# Patient Record
Sex: Female | Born: 1995 | Race: White | Hispanic: Yes | Marital: Single | Smoking: Never smoker
Health system: Southern US, Community
[De-identification: ages and names within clinical notes are randomized; demographics above are authoritative.]

## PROBLEM LIST (undated history)

## (undated) DIAGNOSIS — R51 Headache: Secondary | ICD-10-CM

## (undated) HISTORY — DX: Headache: R51

---
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
## 2014-12-29 ENCOUNTER — Encounter: Payer: Self-pay | Admitting: Emergency Medicine

## 2014-12-29 ENCOUNTER — Emergency Department
Admission: EM | Admit: 2014-12-29 | Discharge: 2014-12-29 | Disposition: A | Discharge status: Home / Self Care | Payer: No Typology Code available for payment source | Attending: Emergency Medicine | Admitting: Emergency Medicine

## 2014-12-29 ENCOUNTER — Emergency Department: Payer: No Typology Code available for payment source

## 2014-12-29 DIAGNOSIS — R102 Pelvic and perineal pain unspecified side: Secondary | ICD-10-CM

## 2014-12-29 DIAGNOSIS — Z3202 Encounter for pregnancy test, result negative: Secondary | ICD-10-CM | POA: Diagnosis not present

## 2014-12-29 DIAGNOSIS — R1032 Left lower quadrant pain: Secondary | ICD-10-CM | POA: Diagnosis present

## 2014-12-29 LAB — WET PREP, GENITAL
Clue Cells Wet Prep HPF POC: NONE SEEN
Trich, Wet Prep: NONE SEEN
Yeast Wet Prep HPF POC: NONE SEEN

## 2014-12-29 LAB — COMPREHENSIVE METABOLIC PANEL
ALK PHOS: 59 U/L (ref 38–126)
ALT: 14 U/L (ref 14–54)
ANION GAP: 7 (ref 5–15)
AST: 20 U/L (ref 15–41)
Albumin: 4.7 g/dL (ref 3.5–5.0)
BUN: 8 mg/dL (ref 6–20)
CO2: 25 mmol/L (ref 22–32)
Calcium: 9.3 mg/dL (ref 8.9–10.3)
Chloride: 107 mmol/L (ref 101–111)
Creatinine, Ser: 0.54 mg/dL (ref 0.44–1.00)
GFR calc Af Amer: >60 mL/min (ref >60)
GFR calc non Af Amer: >60 mL/min (ref >60)
GLUCOSE: 98 mg/dL (ref 65–99)
POTASSIUM: 3.3 mmol/L — AB (ref 3.5–5.1)
SODIUM: 139 mmol/L (ref 135–145)
Total Bilirubin: 0.6 mg/dL (ref 0.3–1.2)
Total Protein: 7.7 g/dL (ref 6.5–8.1)

## 2014-12-29 LAB — CBC WITH DIFFERENTIAL/PLATELET
BASOS ABS: 0.1 10*3/uL (ref 0–0.1)
BASOS PCT: 1 %
EOS ABS: 0.1 10*3/uL (ref 0–0.7)
Eosinophils Relative: 1 %
HEMATOCRIT: 38.8 % (ref 35.0–47.0)
HEMOGLOBIN: 13.3 g/dL (ref 12.0–16.0)
Lymphocytes Relative: 15 %
Lymphs Abs: 2 10*3/uL (ref 1.0–3.6)
MCH: 29.9 pg (ref 26.0–34.0)
MCHC: 34.3 g/dL (ref 32.0–36.0)
MCV: 87.1 fL (ref 80.0–100.0)
MONOS PCT: 5 %
Monocytes Absolute: 0.6 10*3/uL (ref 0.2–0.9)
NEUTROS ABS: 10.3 10*3/uL — AB (ref 1.4–6.5)
NEUTROS PCT: 78 %
Platelets: 268 10*3/uL (ref 150–440)
RBC: 4.45 MIL/uL (ref 3.80–5.20)
RDW: 12.8 % (ref 11.5–14.5)
WBC: 13.1 10*3/uL — ABNORMAL HIGH (ref 3.6–11.0)

## 2014-12-29 LAB — URINALYSIS COMPLETE WITH MICROSCOPIC (ARMC ONLY)
Bilirubin Urine: NEGATIVE
Glucose, UA: NEGATIVE mg/dL
HGB URINE DIPSTICK: NEGATIVE
LEUKOCYTES UA: NEGATIVE
NITRITE: NEGATIVE
PH: 5 (ref 5.0–8.0)
PROTEIN: NEGATIVE mg/dL
SPECIFIC GRAVITY, URINE: 1.03 (ref 1.005–1.030)

## 2014-12-29 LAB — LIPASE, BLOOD: Lipase: 27 U/L (ref 22–51)

## 2014-12-29 LAB — POCT PREGNANCY, URINE: PREG TEST UR: NEGATIVE

## 2014-12-29 MED ORDER — HYDROCODONE-ACETAMINOPHEN 5-325 MG PO TABS
1.0000 | ORAL_TABLET | Freq: Once | ORAL | Status: AC
  Administered 2014-12-29: 1 via ORAL
  Filled 2014-12-29: qty 1

## 2014-12-29 MED ORDER — HYDROCODONE-ACETAMINOPHEN 5-325 MG PO TABS: 1.0000 | ORAL_TABLET | ORAL | Status: DC | PRN

## 2014-12-29 NOTE — ED Notes (Signed)
Pt reports felt nauseous yesterday. Denies nausea, vomiting or diarrhea today. Reports LLQ pain. Denies fever.Denies difficulty urinating. LMP started today.

## 2014-12-29 NOTE — ED Provider Notes (Signed)
[facility] Emergency Department Provider Note  ____________________________________________  Time seen: 1820  I have reviewed the triage vital signs and the nursing notes.   HISTORY  Chief Complaint Abdominal Pain     HPI Kristin Todd is a 19 y.o. female who reports began to have pain in her left lower abdomen, left pelvis, last night. It was sharp and continues to be uncomfortable. This morning, she began to have her menstrual cycle. She reports the pain does not feel like usual cramping. It is sharper and more focal. She denies any nausea, vomiting, or diarrhea. She has not had similar symptoms in the past.    Past Medical History  Diagnosis Date  . WUJWJXBJ(478.2)     Patient Active Problem List   Diagnosis Date Noted  . Migraine without aura and without status migrainosus, not intractable 01/08/2013    History reviewed. No pertinent past surgical history.  Current Outpatient Rx  Name  Route  Sig  Dispense  Refill  . HYDROcodone-acetaminophen (NORCO) 5-325 MG tablet   Oral   Take 1 tablet by mouth every 4 (four) hours as needed for moderate pain.   6 tablet   0     Allergies Review of patient's allergies indicates no known allergies.  Family History  Problem Relation Age of Onset  . Migraines Paternal Aunt   . Seizures Paternal Uncle     Seizures as a child    Social History Social History  Substance Use Topics  . Smoking status: Never Smoker   . Smokeless tobacco: Never Used  . Alcohol Use: No    Review of Systems  Constitutional: Negative for fever. ENT: Negative for sore throat. Cardiovascular: Negative for chest pain. Respiratory: Negative for shortness of breath. Gastrointestinal: Negative for vomiting and diarrhea. Positive for pain in the left lower quadrant. See history of present illness Genitourinary: Notable for pelvic pain. See history of present illness Musculoskeletal: No myalgias or  injuries. Skin: Negative for rash. Neurological: Negative for headaches   10-point ROS otherwise negative.  ____________________________________________   PHYSICAL EXAM:  VITAL SIGNS: ED Triage Vitals  Enc Vitals Group     BP 12/29/14 1518 125/79 mmHg     Pulse Rate 12/29/14 1518 102     Resp 12/29/14 1518 18     Temp 12/29/14 1518 98.3 F (36.8 C)     Temp Source 12/29/14 1518 Oral     SpO2 12/29/14 1518 100 %     Weight 12/29/14 1518 125 lb (56.7 kg)     Height 12/29/14 1518  (1.626 m)     Head Cir --      Peak Flow --      Pain Score 12/29/14 1519 5     Pain Loc --      Pain Edu? --      Excl. in GC? --     Constitutional:  Alert and oriented. Well appearing and in no distress. ENT   Head: Normocephalic and atraumatic.   Nose: No congestion/rhinnorhea.   Mouth/Throat: Mucous membranes are moist. Cardiovascular: Normal rate, regular rhythm, no murmur noted Respiratory:  Normal respiratory effort, no tachypnea.    Breath sounds are clear and equal bilaterally.  Gastrointestinal: Soft. Some discomfort in the lower left abdomen, left pelvic area. No distention.  Pelvic: Normal female external genitalia. Normal speculum exam, with blood present, but no other discharge. Normal-appearing cervix. No cervical motion tenderness. Noted left adnexal tenderness on bimanual exam. Back: No muscle spasm, no  tenderness, no CVA tenderness. Musculoskeletal: No deformity noted. Nontender with normal range of motion in all extremities.  No noted edema. Neurologic:  Normal speech and language. No gross focal neurologic deficits are appreciated.  Skin:  Skin is warm, dry. No rash noted. Psychiatric: Mood and affect are normal. Speech and behavior are normal.  ____________________________________________    LABS (pertinent positives/negatives)  Labs Reviewed  WET PREP, GENITAL - Abnormal; Notable for the following:    WBC, Wet Prep HPF POC RARE (*)    All other  components within normal limits  CBC WITH DIFFERENTIAL/PLATELET - Abnormal; Notable for the following:    WBC 13.1 (*)    Neutro Abs 10.3 (*)    All other components within normal limits  COMPREHENSIVE METABOLIC PANEL - Abnormal; Notable for the following:    Potassium 3.3 (*)    All other components within normal limits  URINALYSIS COMPLETEWITH MICROSCOPIC ([facility] ONLY) - Abnormal; Notable for the following:    Color, Urine YELLOW (*)    APPearance CLEAR (*)    Ketones, ur 1+ (*)    Bacteria, UA RARE (*)    Squamous Epithelial / LPF 0-5 (*)    All other components within normal limits  CHLAMYDIA/NGC RT PCR ([facility] ONLY)  LIPASE, BLOOD  POC URINE PREG, ED  POCT PREGNANCY, URINE   ____________________________________________    RADIOLOGY  Pelvic ultrasound IMPRESSION: Moderate amount of free fluid is nonspecific.  Normal arterial and venous Doppler flow is documented in both ovaries.  ____________________________________________   INITIAL IMPRESSION / ASSESSMENT AND PLAN / ED COURSE  Pertinent labs & imaging results that were available during my care of the patient were reviewed by me and considered in my medical decision making (see chart for details).  19 year old female with left adnexal pain. We will perform an ultrasound to assess for cyst versus torsion.  ----------------------------------------- 8:18 PM on 12/29/2014 -----------------------------------------  Ultrasound does not show any cystic structures. There is a moderate amount of free fluid in the pelvis.   At this time, the patient looks more comfortable lying on her left side. We will treat her pain with Vicodin.  I discussed performing a pelvic exam, and the patient agrees and consents.  ----------------------------------------- 10:15 PM on 12/29/2014 -----------------------------------------  Pelvic exam was unremarkable except for left adnexal tenderness as noted in the exam portion  above.  The patient feels much more comfortable left her having had one Vicodin. We will prescribe 6 tablets to be used as needed.  She has been seen at [facility] in the past and I will ask her to follow with them.  ____________________________________________   FINAL CLINICAL IMPRESSION(S) / ED DIAGNOSES  Final diagnoses:  Pelvic pain in female       Darien Ramus, MD 12/29/14 2219

## 2014-12-29 NOTE — ED Notes (Signed)
Began yesterday, nausea but no vomiting, no diarrhea

## 2014-12-30 LAB — CHLAMYDIA/NGC RT PCR (ARMC ONLY)
Chlamydia Tr: NOT DETECTED
N gonorrhoeae: NOT DETECTED

## 2016-07-08 IMAGING — US US ART/VEN ABD/PELV/SCROTUM DOPPLER LTD
1 series · 13 of 25 positions shown · non-contrast
Comparison: None.

CLINICAL DATA: Left adnexal pain for 24 hr

EXAM:
TRANSABDOMINAL AND TRANSVAGINAL ULTRASOUND OF PELVIS
DOPPLER ULTRASOUND OF OVARIES
TECHNIQUE: Both transabdominal and transvaginal ultrasound examinations of the
pelvis were performed. Transabdominal technique was performed for
global imaging of the pelvis including uterus, ovaries, adnexal
regions, and pelvic cul-de-sac.
It was necessary to proceed with endovaginal exam following the
transabdominal exam to visualize the adnexa. Color and duplex
Doppler ultrasound was utilized to evaluate blood flow to the
ovaries.

[Series 1: us art/ven abd/pelv/scrotum doppler ltd · 0.20mm/px · 13 of 133 slices shown]
[im 1/133]
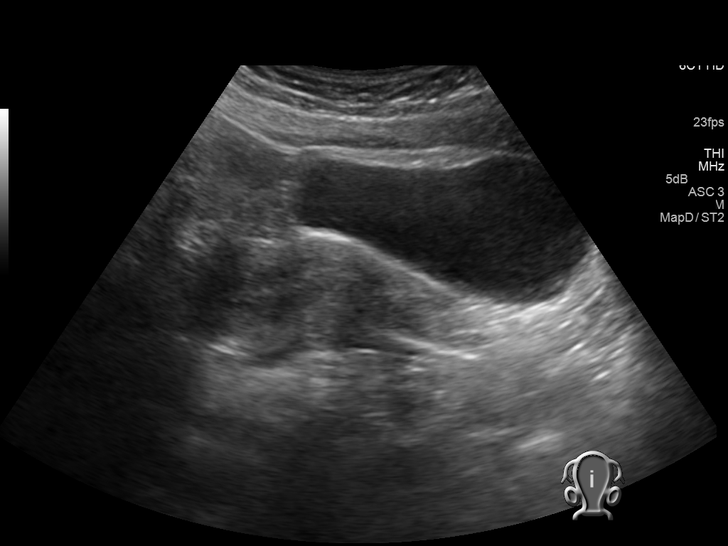
[im 12/133]
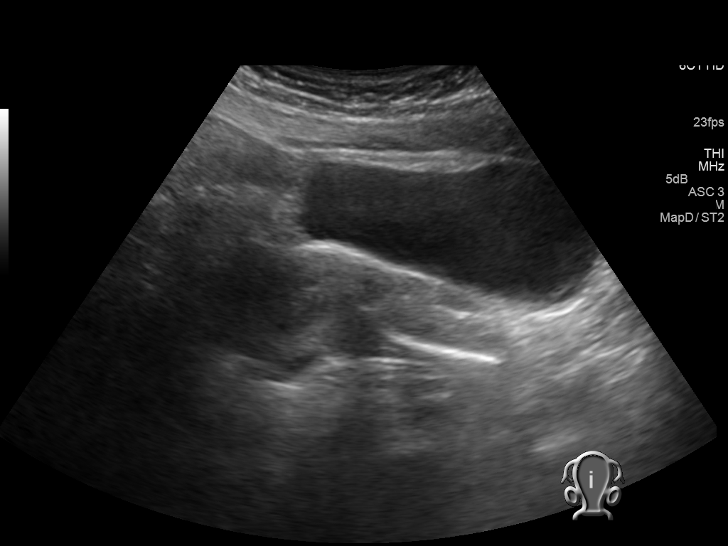
[im 23/133]
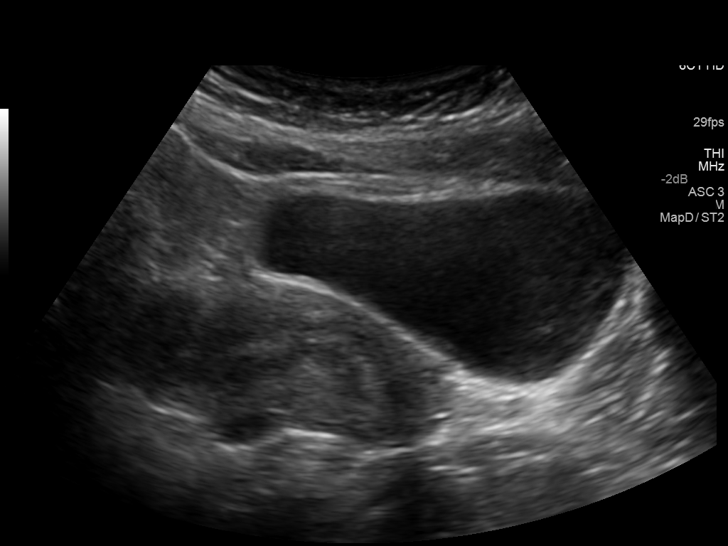
[im 34/133]
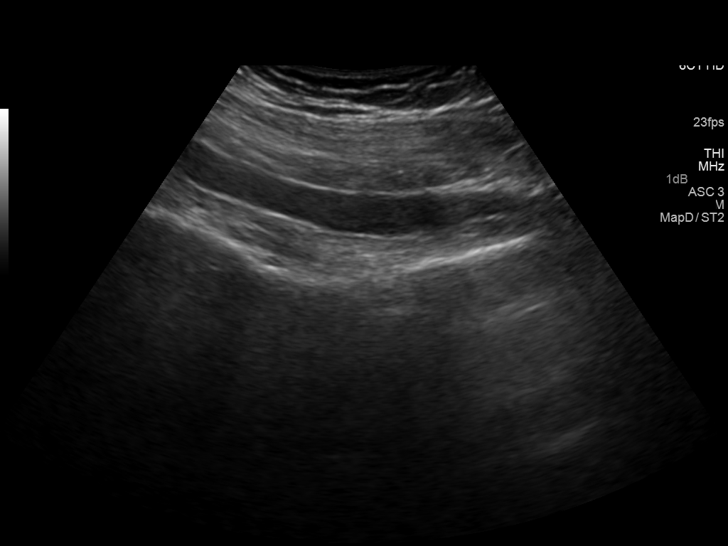
[im 45/133]
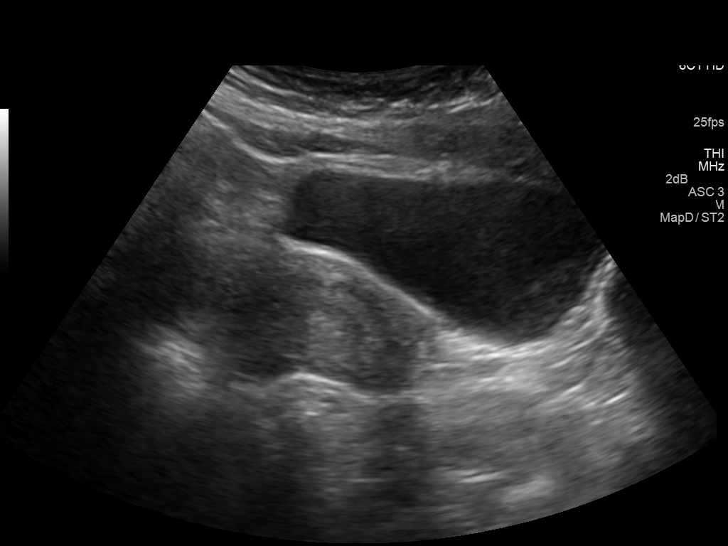
[im 56/133]
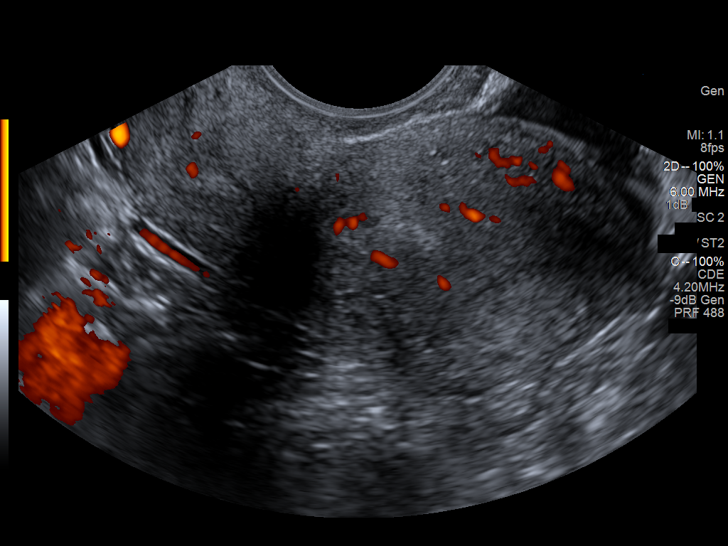
[im 67/133]
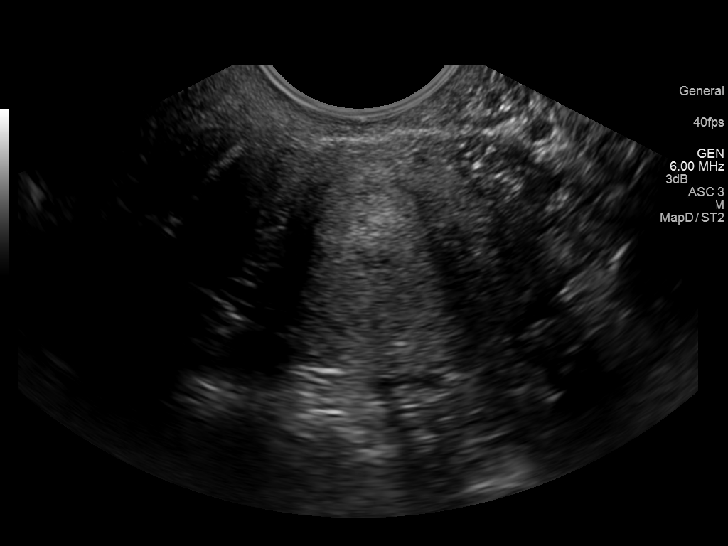
[im 78/133]
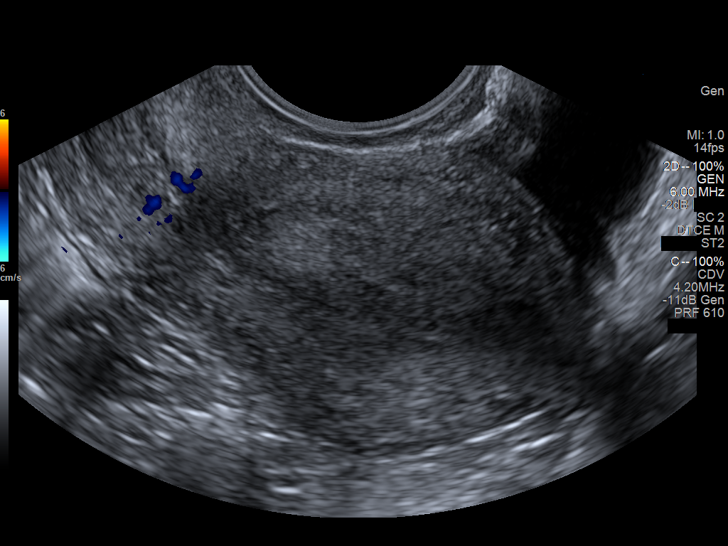
[im 89/133]
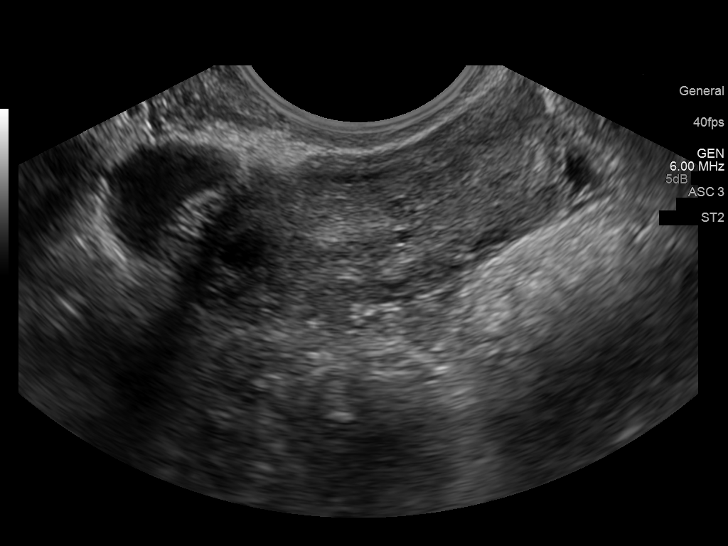
[im 100/133]
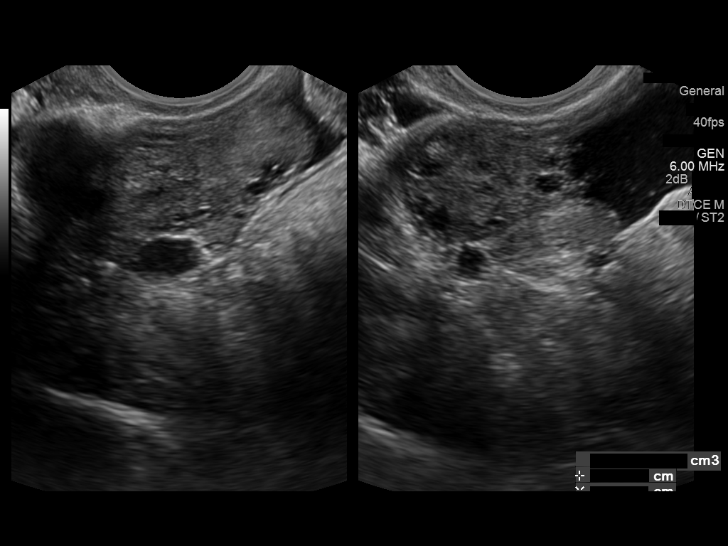
[im 111/133]
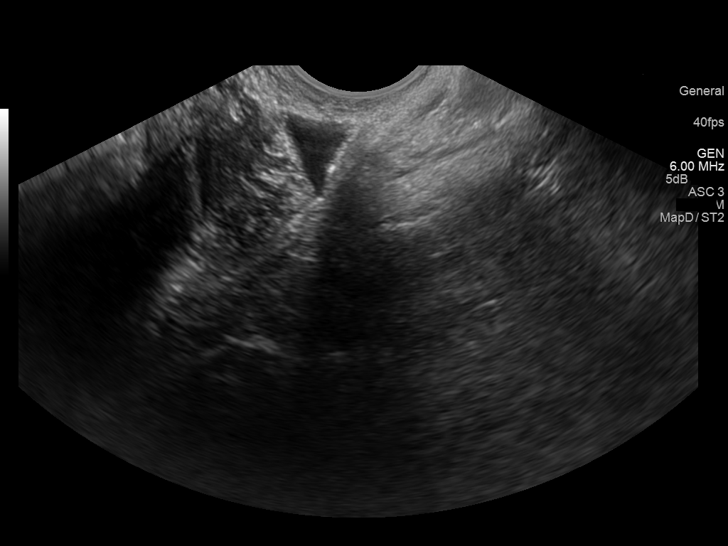
[im 122/133]
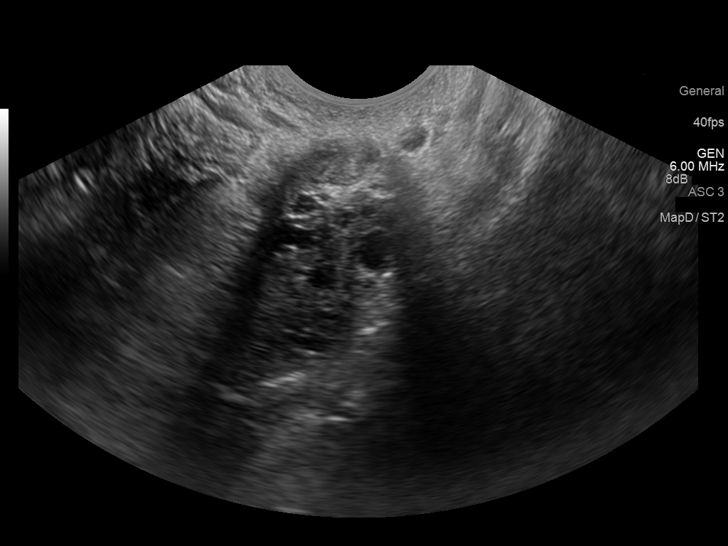
[im 133/133]
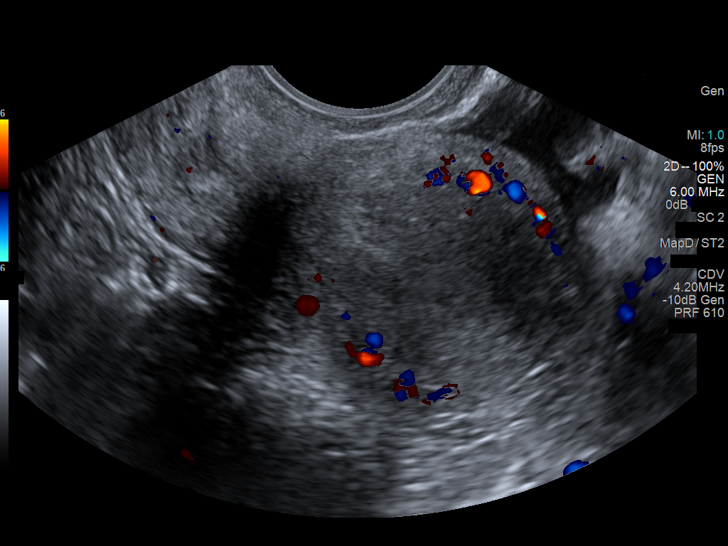

[13 of 25 positions shown; findings below may reference images not displayed]

FINDINGS: Uterus

Measurements: 6.2 x 3.3 x 4.4 cm. No fibroids or other mass
visualized.

Endometrium

Thickness: 11 mm.  No focal abnormality visualized.

Right ovary

Measurements: 4.0 x 2.2 x 2.8 cm. Normal appearance/no adnexal mass.

Left ovary

Measurements: 2.5 x 1.5 x 1.8 cm. Normal appearance/no adnexal mass.

Pulsed Doppler evaluation of both ovaries demonstrates normal
low-resistance arterial and venous waveforms.

Other findings

There is a moderate amount of free fluid which is nonspecific.
IMPRESSION: Moderate amount of free fluid is nonspecific.

Normal arterial and venous Doppler flow is documented in both
ovaries.

## 2017-08-21 ENCOUNTER — Emergency Department
Admission: EM | Admit: 2017-08-21 | Discharge: 2017-08-21 | Disposition: A | Discharge status: Home / Self Care | Payer: No Typology Code available for payment source | Attending: Emergency Medicine | Admitting: Emergency Medicine

## 2017-08-21 ENCOUNTER — Encounter: Payer: Self-pay | Admitting: Emergency Medicine

## 2017-08-21 ENCOUNTER — Other Ambulatory Visit: Payer: Self-pay

## 2017-08-21 DIAGNOSIS — Z79899 Other long term (current) drug therapy: Secondary | ICD-10-CM | POA: Insufficient documentation

## 2017-08-21 DIAGNOSIS — R51 Headache: Secondary | ICD-10-CM | POA: Diagnosis present

## 2017-08-21 DIAGNOSIS — G43001 Migraine without aura, not intractable, with status migrainosus: Secondary | ICD-10-CM | POA: Diagnosis not present

## 2017-08-21 MED ORDER — DIPHENHYDRAMINE HCL 50 MG/ML IJ SOLN
25.0000 mg | Freq: Once | INTRAMUSCULAR | Status: AC
  Administered 2017-08-21: 25 mg via INTRAVENOUS
  Filled 2017-08-21: qty 1

## 2017-08-21 MED ORDER — SODIUM CHLORIDE 0.9 % IV BOLUS
1000.0000 mL | Freq: Once | INTRAVENOUS | Status: AC
  Administered 2017-08-21: 1000 mL via INTRAVENOUS

## 2017-08-21 MED ORDER — KETOROLAC TROMETHAMINE 30 MG/ML IJ SOLN
30.0000 mg | Freq: Once | INTRAMUSCULAR | Status: AC
  Administered 2017-08-21: 30 mg via INTRAVENOUS
  Filled 2017-08-21: qty 1

## 2017-08-21 MED ORDER — ONDANSETRON HCL 4 MG/2ML IJ SOLN
4.0000 mg | Freq: Once | INTRAMUSCULAR | Status: AC
  Administered 2017-08-21: 4 mg via INTRAVENOUS
  Filled 2017-08-21: qty 2

## 2017-08-21 NOTE — ED Notes (Signed)
See triage note  States she developed headache about 2 weeks ago no fever or trauma  States she has had some n/v  Last time vomited was yesterday  Afebrile on arrival   States she has been taking ibu with min relief.    States pain is frontal area and at temporal areas

## 2017-08-21 NOTE — Discharge Instructions (Addendum)
Drink plenty of fluids.  Take over-the-counter Tylenol or ibuprofen as needed for pain.  If you are having pain that is radiating up to the temples try using the muscle relaxer as this may be a tension headache.  Make an appointment with a neurologist to evaluate your headaches since they seem to be worsening.  Return to emergency department if you are becoming worse.

## 2017-08-21 NOTE — ED Provider Notes (Signed)
[facility] Emergency Department Provider Note  ____________________________________________   First MD Initiated Contact with Patient 08/21/17 1434     (approximate)  I have reviewed the triage vital signs and the nursing notes.   HISTORY  Chief Complaint Headache    HPI Kristin Todd is a 22 y.o. female presents emergency department complaining of a headache for 2 weeks.  She denies any fever or chills.  She denies head congestion or cough.  She states she does have some nausea and vomiting due to the headache.  She states she is sensitive to light.  She has been taking ibuprofen without relief.  She states she was seen at the acute care and they gave her something a little stronger than ibuprofen but is not helped.  She is becoming concerned she goes to bed with a headache and wakes with a headache.  She denies any known tick bites.  She states the pain is mostly at her temples.  Past Medical History:  Diagnosis Date  . WJXBJYNW(295.6)     Patient Active Problem List   Diagnosis Date Noted  . Migraine without aura and without status migrainosus, not intractable 01/08/2013    History reviewed. No pertinent surgical history.  Prior to Admission medications   Medication Sig Start Date End Date Taking? Authorizing Provider  cyclobenzaprine (FLEXERIL) 10 MG tablet Take 10 mg by mouth 3 (three) times daily as needed for muscle spasms.   Yes [provider]  naproxen (NAPROSYN) 500 MG tablet Take 500 mg by mouth 2 (two) times daily with a meal.   Yes [provider]    Allergies Patient has no known allergies.  Family History  Problem Relation Age of Onset  . Migraines Paternal Aunt   . Seizures Paternal Uncle        Seizures as a child    Social History Social History   Tobacco Use  . Smoking status: Never Smoker  . Smokeless tobacco: Never Used  Substance Use Topics  . Alcohol use: No  . Drug use: No     Review of Systems  Constitutional: No fever/chills, positive for headache Eyes: No visual changes.  Positive for light sensitivity ENT: No sore throat. Respiratory: Denies cough Genitourinary: Negative for dysuria. Musculoskeletal: Negative for back pain. Skin: Negative for rash.    ____________________________________________   PHYSICAL EXAM:  VITAL SIGNS: ED Triage Vitals  Enc Vitals Group     BP 08/21/17 1355 138/89     Pulse Rate 08/21/17 1355 (!) 102     Resp 08/21/17 1355 16     Temp 08/21/17 1355 98.3 F (36.8 C)     Temp Source 08/21/17 1355 Oral     SpO2 08/21/17 1355 97 %     Weight 08/21/17 1356 134 lb (60.8 kg)     Height 08/21/17 1356  (1.626 m)     Head Circumference --      Peak Flow --      Pain Score 08/21/17 1356 7     Pain Loc --      Pain Edu? --      Excl. in GC? --     Constitutional: Alert and oriented. Well appearing and in no acute distress. Eyes: Conjunctivae are normal. perrl Head: Atraumatic.  Temples are mildly tender bilaterally Nose: No congestion/rhinnorhea. Mouth/Throat: Mucous membranes are moist.  Throat appears normal neck: Is supple, no lymphadenopathy is noted.  There is no cervical tenderness Cardiovascular: Normal rate, regular rhythm.  Heart sounds are normal Respiratory: Normal respiratory effort.  No retractions, lungs are clear to auscultation GU: deferred Musculoskeletal: FROM all extremities, warm and well perfused Neurologic:  Normal speech and language.  Cranial nerves II through XII are grossly intact Skin:  Skin is warm, dry and intact. No rash noted. Psychiatric: Mood and affect are normal. Speech and behavior are normal.  ____________________________________________   LABS (all labs ordered are listed, but only abnormal results are displayed)  Labs Reviewed - No data to  display ____________________________________________   ____________________________________________  RADIOLOGY    ____________________________________________   PROCEDURES  Procedure(s) performed: Saline lock, 1 L normal saline, Toradol 30 mg IV, Benadryl 25 mg IV, Zofran 4 mg IV  Procedures    ____________________________________________   INITIAL IMPRESSION / ASSESSMENT AND PLAN / ED COURSE  Pertinent labs & imaging results that were available during my care of the patient were reviewed by me and considered in my medical decision making (see chart for details).  Patient is a 22 year old female presents emergency department complaining of headache for about 2 weeks.  She denies fever or injury.  She states she has a history of headaches but this is lasted longer than her normal.  She states that she goes to bed with headache and wakes up with a headache.  She states she had some nausea and vomiting due to the headache.  She tried taking over-the-counter medications without any relief.  She went to the urgent care and they gave her some medication which apparently helped.  She states she is sensitive to light also.  On physical exam patient appears well.  Cranial nerves II through XII are grossly intact.  Remainder the exam is unremarkable  Patient was given saline 1 L IV, Toradol 30 mg IV, Zofran 4 mg IV, and Benadryl 25 mg IV.  After 35 to 45 minutes after the medication patient states she is feeling much better.  The family member with her states that she is very sleepy.  I explained that is coming from the Benadryl.  The patient will remain in the ED until she has had all of the fluids from the saline and then be discharged home.  The patient and family member were given instructions to alternate Tylenol and ibuprofen as needed.  She still has muscle relaxants for tension type headaches and she should use these.  If she is worsening she sugars return to the emergency  department.  She was also given a follow-up with a neurologist.     As part of my medical decision making, I reviewed the following data within the electronic MEDICAL RECORD NUMBER History obtained from family, Nursing notes reviewed and incorporated, Old chart reviewed, Notes from prior ED visits and [place] Controlled Substance Database  ____________________________________________   FINAL CLINICAL IMPRESSION(S) / ED DIAGNOSES  Final diagnoses:  Migraine without aura and with status migrainosus, not intractable      NEW MEDICATIONS STARTED DURING THIS VISIT:  Current Discharge Medication List       Note:  This document was prepared using Dragon voice recognition software and may include unintentional dictation errors.    Faythe Ghee, PA-C 08/21/17 1611    Dionne Bucy, MD 08/21/17 1655

## 2017-08-21 NOTE — ED Triage Notes (Signed)
Says headache temples and back of head for 2 weeks.

## 2018-10-04 ENCOUNTER — Other Ambulatory Visit: Payer: Self-pay

## 2018-10-04 ENCOUNTER — Emergency Department
Admission: EM | Admit: 2018-10-04 | Discharge: 2018-10-04 | Disposition: A | Discharge status: Home / Self Care | Payer: No Typology Code available for payment source | Attending: Emergency Medicine | Admitting: Emergency Medicine

## 2018-10-04 DIAGNOSIS — Y92012 Bathroom of single-family (private) house as the place of occurrence of the external cause: Secondary | ICD-10-CM | POA: Diagnosis not present

## 2018-10-04 DIAGNOSIS — Y999 Unspecified external cause status: Secondary | ICD-10-CM | POA: Insufficient documentation

## 2018-10-04 DIAGNOSIS — T23061A Burn of unspecified degree of back of right hand, initial encounter: Secondary | ICD-10-CM | POA: Diagnosis present

## 2018-10-04 DIAGNOSIS — X12XXXA Contact with other hot fluids, initial encounter: Secondary | ICD-10-CM | POA: Insufficient documentation

## 2018-10-04 DIAGNOSIS — Y93E8 Activity, other personal hygiene: Secondary | ICD-10-CM | POA: Diagnosis not present

## 2018-10-04 DIAGNOSIS — T23261A Burn of second degree of back of right hand, initial encounter: Secondary | ICD-10-CM | POA: Insufficient documentation

## 2018-10-04 MED ORDER — HYDROCODONE-ACETAMINOPHEN 5-325 MG PO TABS: 1.0000 | ORAL_TABLET | Freq: Four times a day (QID) | ORAL | 0 refills | Status: AC | PRN

## 2018-10-04 MED ORDER — SILVER SULFADIAZINE 1 % EX CREA
TOPICAL_CREAM | Freq: Once | CUTANEOUS | Status: AC
Start: 2018-10-04 — End: 2018-10-04
  Administered 2018-10-04: 23:00:00 via TOPICAL
  Filled 2018-10-04: qty 85

## 2018-10-04 MED ORDER — MORPHINE SULFATE (PF) 4 MG/ML IV SOLN
4.0000 mg | Freq: Once | INTRAVENOUS | Status: AC
  Administered 2018-10-04: 4 mg via INTRAMUSCULAR
  Filled 2018-10-04: qty 1
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
## 2018-10-04 NOTE — ED Notes (Signed)
Pt has wet to dry dressing in place at this time. Pt taken to room 13

## 2018-10-04 NOTE — ED Triage Notes (Signed)
Pt comes via POV from home with c/o right hand burn. Pt states she was about to wax her eyebrows when she dropped some of the wax onto her hand.  Pt has open skin, redness and possible start of little blisters at wrist.

## 2018-10-04 NOTE — ED Notes (Signed)
New dressing applied.

## 2018-10-04 NOTE — ED Provider Notes (Signed)
[facility] Emergency Department Provider Note ____________________________________________   First MD Initiated Contact with Patient 10/04/18 2053     (approximate)  I have reviewed the triage vital signs and the nursing notes.   HISTORY  Chief Complaint Burn    HPI Kristin Todd is a 23 y.o. female with PMH as noted below who presents with burn to the right hand, acute onset around 7 PM today when the patient was getting ready to wax her eyebrows and spilled the hot wax onto her hand.  She reports that the skin immediately sloughed off of the area.  She denies any other injuries.  Past Medical History:  Diagnosis Date  . QVZDGLOV(564.3)     Patient Active Problem List   Diagnosis Date Noted  . Migraine without aura and without status migrainosus, not intractable 01/08/2013    History reviewed. No pertinent surgical history.  Prior to Admission medications   Medication Sig Start Date End Date Taking? Authorizing Provider  cyclobenzaprine (FLEXERIL) 10 MG tablet Take 10 mg by mouth 3 (three) times daily as needed for muscle spasms.    [provider]  HYDROcodone-acetaminophen (NORCO/VICODIN) 5-325 MG tablet Take 1 tablet by mouth every 6 (six) hours as needed for up to 5 days for severe pain. 10/04/18 10/09/18  Arta Silence, MD  naproxen (NAPROSYN) 500 MG tablet Take 500 mg by mouth 2 (two) times daily with a meal.    [provider]    Allergies Patient has no known allergies.  Family History  Problem Relation Age of Onset  . Migraines Paternal Aunt   . Seizures Paternal Uncle        Seizures as a child    Social History Social History   Tobacco Use  . Smoking status: Never Smoker  . Smokeless tobacco: Never Used  Substance Use Topics  . Alcohol use: No  . Drug use: No    Review of Systems  Constitutional: No fever. Eyes: No eye injury. ENT: No sore throat. Cardiovascular: Denies chest pain.  Respiratory: Denies shortness of breath. Gastrointestinal: No vomiting.  Genitourinary: Negative for flank pain.  Musculoskeletal: Negative for back pain. Skin: Negative for rash.  Positive for right hand burn. Neurological: Negative for headache.   ____________________________________________   PHYSICAL EXAM:  VITAL SIGNS: ED Triage Vitals  Enc Vitals Group     BP 10/04/18 2022 129/87     Pulse Rate 10/04/18 2022 (!) 105     Resp 10/04/18 2022 20     Temp 10/04/18 2022 99.1 F (37.3 C)     Temp Source 10/04/18 2022 Oral     SpO2 10/04/18 2022 98 %     Weight 10/04/18 2021 147 lb (66.7 kg)     Height 10/04/18 2021 5\' 4"  (1.626 m)     Head Circumference --      Peak Flow --      Pain Score 10/04/18 2030 7     Pain Loc --      Pain Edu? --      Excl. in [place]? --     Constitutional: Alert and oriented. Well appearing and in no acute distress. Eyes: Conjunctivae are normal.  Head: Atraumatic. Nose: No congestion/rhinnorhea. Mouth/Throat: Mucous membranes are moist.   Neck: Normal range of motion.  Cardiovascular: Normal rate, regular rhythm. Good peripheral circulation. Respiratory: Normal respiratory effort. Gastrointestinal: No distention.  Musculoskeletal:  Extremities warm and well perfused.  Right hand with approximately 4 x 5 cm area of  superficial partial-thickness burn over the dorsal hand just proximal to the first MCP joint and over the anatomical snuffbox.  Burn is not circumferential or over joints.  Full range of motion at the thumb and second digit. Neurologic:  Normal speech and language. No gross focal neurologic deficits are appreciated.  Skin:  Skin is warm and dry. No rash noted. Psychiatric: Mood and affect are normal. Speech and behavior are normal.  ____________________________________________   LABS (all labs ordered are listed, but only abnormal results are displayed)  Labs Reviewed - No data to display  ____________________________________________  EKG   ____________________________________________  RADIOLOGY    ____________________________________________   PROCEDURES  Procedure(s) performed: No  Procedures  Critical Care performed: No ____________________________________________   INITIAL IMPRESSION / ASSESSMENT AND PLAN / ED COURSE  Pertinent labs & imaging results that were available during my care of the patient were reviewed by me and considered in my medical decision making (see chart for details).  23 year old female presents with a burn to the dorsum of the right hand with hot wax.  She has no other injuries.  On exam there is a 4 x 5 cm superficial partial-thickness burn to the dorsum of the hand just proximal to the thumb which is not circumferential.  [facility].  I discussed the case with the on-call provider Iona Beardan Blandon Hendricks who advised that the patient would be appropriate for close outpatient follow-up in the burn clinic tomorrow or the following day.  I discussed this with the patient.  She feels comfortable going home.  She is stable for discharge at this time.  She did well with morphine in the ED.  We applied Silvadene cream and a sterile dressing as per burn center recommendations.  The patient knows she needs to call for follow-up tomorrow.  Return precautions given, and she expressed understanding.  ____________________________________________   FINAL CLINICAL IMPRESSION(S) / ED DIAGNOSES  Final diagnoses:  Partial thickness burn of back of right hand, initial encounter      NEW MEDICATIONS STARTED DURING THIS VISIT:  Discharge Medication List as of 10/04/2018 11:13 PM    START taking these medications   Details  HYDROcodone-acetaminophen (NORCO/VICODIN) 5-325 MG tablet Take 1 tablet by mouth every 6 (six) hours as needed for up to 5 days for severe pain., Starting Thu 10/04/2018, Until Tue 10/09/2018, Normal          Note:  This document was prepared using Dragon voice recognition software and may include unintentional dictation errors.   Dionne BucySiadecki, Tayloranne Lekas, MD 10/05/18 0040

## 2019-06-21 ENCOUNTER — Ambulatory Visit: Payer: No Typology Code available for payment source | Attending: Internal Medicine

## 2019-06-21 DIAGNOSIS — Z23 Encounter for immunization: Secondary | ICD-10-CM

## 2019-06-21 NOTE — Progress Notes (Signed)
   Covid-19 Vaccination Clinic  Name:  Kristin Todd    MRN: [id number] DOB: 09/11/1995  06/21/2019  [name] was observed post Covid-19 immunization for 15 minutes without incident. She was provided with Vaccine Information Sheet and instruction to access the V-Safe system.   [name] was instructed to call 911 with any severe reactions post vaccine: Marland Kitchen Difficulty breathing  . Swelling of face and throat  . A fast heartbeat  . A bad rash all over body  . Dizziness and weakness   Immunizations Administered    Name Date Dose VIS Date Route   Pfizer COVID-19 Vaccine 06/21/2019  8:17 AM 0.3 mL 03/08/2019 Intramuscular   Manufacturer: [employer], [employer]   Lot: [id number]   NDC: [address]-9

## 2019-07-16 ENCOUNTER — Ambulatory Visit: Payer: No Typology Code available for payment source | Attending: Internal Medicine

## 2019-07-16 DIAGNOSIS — Z23 Encounter for immunization: Secondary | ICD-10-CM

## 2019-07-16 NOTE — Progress Notes (Signed)
   Covid-19 Vaccination Clinic  Name:  Paulene Tayag    MRN: [id number] DOB: 1996-03-23  07/16/2019  Ms. Marsiglia was observed post Covid-19 immunization for 15 minutes without incident. She was provided with Vaccine Information Sheet and instruction to access the V-Safe system.   Ms. Herbel was instructed to call 911 with any severe reactions post vaccine: Marland Kitchen Difficulty breathing  . Swelling of face and throat  . A fast heartbeat  . A bad rash all over body  . Dizziness and weakness   Immunizations Administered    Name Date Dose VIS Date Route   Pfizer COVID-19 Vaccine 07/16/2019  8:52 AM 0.3 mL 05/22/2018 Intramuscular   Manufacturer: [employer], [employer]   Lot: [id number]   NDC: [address]-7

## 2020-04-08 ENCOUNTER — Other Ambulatory Visit: Payer: Self-pay

## 2020-04-08 ENCOUNTER — Encounter: Payer: Self-pay | Admitting: *Deleted

## 2020-04-08 DIAGNOSIS — A020 Salmonella enteritis: Secondary | ICD-10-CM | POA: Insufficient documentation

## 2020-04-08 DIAGNOSIS — R197 Diarrhea, unspecified: Secondary | ICD-10-CM | POA: Diagnosis present

## 2020-04-08 LAB — URINALYSIS, COMPLETE (UACMP) WITH MICROSCOPIC
Bilirubin Urine: NEGATIVE
Glucose, UA: NEGATIVE mg/dL
Ketones, ur: NEGATIVE mg/dL
Leukocytes,Ua: NEGATIVE
Nitrite: NEGATIVE
Protein, ur: NEGATIVE mg/dL
Specific Gravity, Urine: 1.016 (ref 1.005–1.030)
pH: 6 (ref 5.0–8.0)

## 2020-04-08 LAB — CBC
HCT: 41.5 % (ref 36.0–46.0)
Hemoglobin: 14 g/dL (ref 12.0–15.0)
MCH: 30.4 pg (ref 26.0–34.0)
MCHC: 33.7 g/dL (ref 30.0–36.0)
MCV: 90 fL (ref 80.0–100.0)
Platelets: 245 10*3/uL (ref 150–400)
RBC: 4.61 MIL/uL (ref 3.87–5.11)
RDW: 12.4 % (ref 11.5–15.5)
WBC: 8 10*3/uL (ref 4.0–10.5)
nRBC: 0 % (ref 0.0–0.2)

## 2020-04-08 LAB — COMPREHENSIVE METABOLIC PANEL
ALT: 48 U/L — ABNORMAL HIGH (ref 0–44)
AST: 37 U/L (ref 15–41)
Albumin: 3.9 g/dL (ref 3.5–5.0)
Alkaline Phosphatase: 82 U/L (ref 38–126)
Anion gap: 10 (ref 5–15)
BUN: 10 mg/dL (ref 6–20)
CO2: 25 mmol/L (ref 22–32)
Calcium: 9.5 mg/dL (ref 8.9–10.3)
Chloride: 100 mmol/L (ref 98–111)
Creatinine, Ser: 0.68 mg/dL (ref 0.44–1.00)
GFR, Estimated: >60 mL/min (ref >60)
Glucose, Bld: 103 mg/dL — ABNORMAL HIGH (ref 70–99)
Potassium: 3.4 mmol/L — ABNORMAL LOW (ref 3.5–5.1)
Sodium: 135 mmol/L (ref 135–145)
Total Bilirubin: 0.5 mg/dL (ref 0.3–1.2)
Total Protein: 7.7 g/dL (ref 6.5–8.1)

## 2020-04-08 LAB — LIPASE, BLOOD: Lipase: 32 U/L (ref 11–51)

## 2020-04-08 LAB — POC URINE PREG, ED: Preg Test, Ur: NEGATIVE

## 2020-04-08 NOTE — ED Notes (Signed)
Pt attempted stool sample. Not enough stool for a sample at this time.

## 2020-04-08 NOTE — ED Notes (Signed)
No C.dif. test at this time per Dr. Katrinka Blazing.

## 2020-04-08 NOTE — ED Triage Notes (Signed)
Pt to ED reporting diarrhea, vomiting and fever for the past three days with lower abd cramping. Pt has been using imodium which stops the diarrhea for a short period but then starts shortly after. Afebrile today but reported continued body aches and chill.s negative COVID test on Monday.   Pt also reporting small amount of bright red blood in stool.

## 2020-04-09 ENCOUNTER — Emergency Department
Admission: EM | Admit: 2020-04-09 | Discharge: 2020-04-09 | Disposition: A | Discharge status: Home / Self Care | Payer: No Typology Code available for payment source | Attending: Emergency Medicine | Admitting: Emergency Medicine

## 2020-04-09 DIAGNOSIS — A020 Salmonella enteritis: Secondary | ICD-10-CM

## 2020-04-09 LAB — GASTROINTESTINAL PANEL BY PCR, STOOL (REPLACES STOOL CULTURE)

## 2020-04-09 MED ORDER — ACETAMINOPHEN 500 MG PO TABS
1000.0000 mg | ORAL_TABLET | Freq: Once | ORAL | Status: AC
  Administered 2020-04-09: 1000 mg via ORAL
  Filled 2020-04-09: qty 2

## 2020-04-09 MED ORDER — DICYCLOMINE HCL 10 MG PO CAPS
20.0000 mg | ORAL_CAPSULE | Freq: Once | ORAL | Status: AC
  Administered 2020-04-09: 20 mg via ORAL
  Filled 2020-04-09: qty 2

## 2020-04-09 MED ORDER — IOHEXOL 9 MG/ML PO SOLN
500.0000 mL | ORAL | Status: DC
  Administered 2020-04-09: 500 mL via ORAL

## 2020-04-09 MED ORDER — ONDANSETRON 4 MG PO TBDP: 4.0000 mg | ORAL_TABLET | Freq: Four times a day (QID) | ORAL | 0 refills | Status: AC | PRN

## 2020-04-09 MED ORDER — CIPROFLOXACIN HCL 500 MG PO TABS
500.0000 mg | ORAL_TABLET | Freq: Once | ORAL | Status: AC
  Administered 2020-04-09: 500 mg via ORAL
  Filled 2020-04-09: qty 1

## 2020-04-09 MED ORDER — LACTATED RINGERS IV BOLUS
1000.0000 mL | Freq: Once | INTRAVENOUS | Status: AC
  Administered 2020-04-09: 1000 mL via INTRAVENOUS

## 2020-04-09 MED ORDER — CIPROFLOXACIN HCL 500 MG PO TABS: 500.0000 mg | ORAL_TABLET | Freq: Two times a day (BID) | ORAL | 0 refills | Status: AC

## 2020-04-09 MED ORDER — DICYCLOMINE HCL 20 MG PO TABS: 20.0000 mg | ORAL_TABLET | Freq: Three times a day (TID) | ORAL | 0 refills | Status: AC | PRN

## 2020-04-09 MED ORDER — PROBIOTIC ACIDOPHILUS PO TABS: 1.0000 | ORAL_TABLET | Freq: Every day | ORAL | 0 refills | Status: AC

## 2020-04-09 NOTE — ED Provider Notes (Signed)
[facility] Emergency Department Provider Note   ____________________________________________   Event Date/Time   First MD Initiated Contact with Patient 04/09/20 0016     (approximate)  I have reviewed the triage vital signs and the nursing notes.   HISTORY  Chief Complaint Diarrhea and Emesis    HPI Kristin Todd is a 25 y.o. female with no significant past medical history who presents to the emergency department with 4 days of fevers, body aches, diarrhea.  Is having abdominal cramping whenever she has a bowel movement.  States her abdomen feels sore now.  She did have 1 episode of nausea and vomiting.  States she is now having bright red blood per rectum.  No melena.  Not on blood thinners.  She did travel to [place] December 11 through December 16.  No recent antibiotic use, hospitalization.  States her brother did also have similar symptoms with diarrhea but not as extensive and no bloody stool.  She states that she was concerned initially when she started having fever and body aches that this could be COVID-19.  She had a negative test  on January 10.  She states that her body aches have improved.  No cough, sore throat, shortness of breath.  No previous abdominal surgeries.  She denies a history of ulcerative colitis, Crohn's disease but states she has had GI issues in the past and is followed by gastroenterology as an outpatient but has not yet had a colonoscopy.  She has previously seen [place] with [facility].    Past Medical History:  Diagnosis Date  . INOMVEHM(094.7)     Patient Active Problem List   Diagnosis Date Noted  . Migraine without aura and without status migrainosus, not intractable 01/08/2013    History reviewed. No pertinent surgical history.  Prior to Admission medications   Medication Sig Start Date End Date Taking? Authorizing Provider  cyclobenzaprine (FLEXERIL) 10 MG tablet Take 10 mg by mouth 3 (three) times daily as  needed for muscle spasms.    [provider]  naproxen (NAPROSYN) 500 MG tablet Take 500 mg by mouth 2 (two) times daily with a meal.    [provider]    Allergies Patient has no known allergies.  Family History  Problem Relation Age of Onset  . Migraines Paternal Aunt   . Seizures Paternal Uncle        Seizures as a child    Social History Social History   Tobacco Use  . Smoking status: Never Smoker  . Smokeless tobacco: Never Used  Substance Use Topics  . Alcohol use: No  . Drug use: No    Review of Systems  Constitutional: No fever/chills Eyes: No visual changes. ENT: No sore throat. Cardiovascular: Denies chest pain. Respiratory: Denies shortness of breath. Gastrointestinal: + Nausea and vomiting, bloody diarrhea Genitourinary: Negative for dysuria. Musculoskeletal: Negative for back pain. Skin: Negative for rash. Neurological: Negative for headaches, focal weakness or numbness.   ____________________________________________   PHYSICAL EXAM:  VITAL SIGNS: ED Triage Vitals [04/08/20 2016]  Enc Vitals Group     BP 139/89     Pulse Rate (!) 134     Resp 16     Temp 99.4 F (37.4 C)     Temp Source Oral     SpO2 99 %     Weight 153 lb (69.4 kg)     Height 5\' 4"  (1.626 m)     Head Circumference      Peak  Flow      Pain Score 10     Pain Loc      Pain Edu?      Excl. in [place]?     Constitutional: Alert and oriented. Well appearing and in no acute distress. Eyes: Conjunctivae are normal. PERRL. EOMI. Head: Atraumatic. Nose: No congestion/rhinnorhea. Mouth/Throat: Mucous membranes are moist.  Oropharynx non-erythematous. Neck: No stridor.   Cardiovascular: Regular and tachycardic. Grossly normal heart sounds.  Good peripheral circulation. Respiratory: Normal respiratory effort.  No retractions. Lungs CTAB. Gastrointestinal: Soft and tender throughout the abdomen without guarding or rebound. Musculoskeletal: No lower extremity  tenderness nor edema.  No joint effusions. Neurologic:  Normal speech and language. No gross focal neurologic deficits are appreciated. No gait instability. Skin:  Skin is warm, dry and intact. No rash noted. Psychiatric: Mood and affect are normal. Speech and behavior are normal.  ____________________________________________   LABS (all labs ordered are listed, but only abnormal results are displayed)  Labs Reviewed  GASTROINTESTINAL PANEL BY PCR, STOOL (REPLACES STOOL CULTURE) - Abnormal; Notable for the following components:      Result Value   Salmonella species DETECTED (*)    All other components within normal limits  COMPREHENSIVE METABOLIC PANEL - Abnormal; Notable for the following components:   Potassium 3.4 (*)    Glucose, Bld 103 (*)    ALT 48 (*)    All other components within normal limits  URINALYSIS, COMPLETE (UACMP) WITH MICROSCOPIC - Abnormal; Notable for the following components:   Color, Urine YELLOW (*)    APPearance HAZY (*)    Hgb urine dipstick SMALL (*)    Bacteria, UA RARE (*)    All other components within normal limits  LIPASE, BLOOD  CBC  POC URINE PREG, ED   ____________________________________________  EKG   EKG Interpretation  Date/Time:  Thursday April 09 2020 00:50:23 EST Ventricular Rate:  95 PR Interval:    QRS Duration: 92 QT Interval:  333 QTC Calculation: 419 R Axis:   88 Text Interpretation: Sinus rhythm No old tracing to compare Confirmed by Rochele Raring [id number]) on 04/09/2020 12:56:11 AM       ____________________________________________  RADIOLOGY  ED MD interpretation:  none  Official radiology report(s): No results found.  ____________________________________________   PROCEDURES  Procedure(s) performed: None  Procedures  Critical Care performed: No  ____________________________________________   INITIAL IMPRESSION / ASSESSMENT AND PLAN / ED COURSE  As part of my medical decision making, I reviewed  the following data within the electronic MEDICAL RECORD NUMBER Nursing notes reviewed and incorporated, Labs reviewed without significant acute abnormality other than being positive for Salmonella in her stool, EKG interpreted NSR and Notes from prior ED visits     Patient here with concerns for infectious diarrhea.  Reports intermittent fevers, bloody stool.  Differential also includes colitis, diverticulitis.  Less likely bowel obstruction, perforation, appendicitis based on exam.  Labs today are reassuring.  She is tachycardic here.  May have underlying fever, dehydration.  Will give Tylenol, IV fluids.  Stool studies pending.  Low suspicion for C. difficile.  She did have recent travel to [place].  CT abdomen pelvis pending for further evaluation.  Will give Bentyl for further discomfort.    Patient's stool studies have come back positive for Salmonella.  Given she is having severe diarrhea with more than 10 stools per day, will start Cipro twice a day for 7 days.  Had discussion with patient regarding these results.  Discussed continue with  CT scan versus holding off at this time given we know that she has infectious diarrhea.  She does not have a surgical abdomen.  She would prefer to hold on CT imaging which I feel is very reasonable.  She will follow-up with her gastroenterologist.  She is tolerating p.o. here.  Heart rate has improved with Tylenol and IV fluids.  She is not actively hemorrhaging and her hemoglobin is 14.0 today.   At this time, I do not feel there is any life-threatening condition present. I have reviewed, interpreted and discussed all results (EKG, imaging, lab, urine as appropriate) and exam findings with patient/family. I have reviewed nursing notes and appropriate previous records.  I feel the patient is safe to be discharged home without further emergent workup and can continue workup as an outpatient as needed. Discussed usual and customary return precautions. Patient/family  verbalize understanding and are comfortable with this plan.  Outpatient follow-up has been provided as needed. All questions have been answered.  ____________________________________________   FINAL CLINICAL IMPRESSION(S) / ED DIAGNOSES  Final diagnoses:  Salmonella gastroenteritis     ED Discharge Orders         Ordered    dicyclomine (BENTYL) 20 MG tablet  Every 8 hours PRN        04/09/20 0121    ondansetron (ZOFRAN ODT) 4 MG disintegrating tablet  Every 6 hours PRN        04/09/20 0121    ciprofloxacin (CIPRO) 500 MG tablet  2 times daily        04/09/20 0121    Lactobacillus (PROBIOTIC ACIDOPHILUS) TABS  Daily        04/09/20 0121           Note:  This document was prepared using Dragon voice recognition software and may include unintentional dictation errors.    Jetson Pickrel, Layla Maw, DO 04/09/20 0129

## 2020-04-09 NOTE — ED Notes (Signed)
Lab called and states gastro panel shows salmonella. MD Ward informed.

## 2020-04-09 NOTE — Discharge Instructions (Signed)
You may alternate Tylenol 1000 mg every 6 hours as needed for pain, fever and Ibuprofen 800 mg every 8 hours as needed for pain, fever.  Please take Ibuprofen with food.  Do not take more than 4000 mg of Tylenol (acetaminophen) in a 24 hour period.  You may continue to use over-the-counter Imodium as needed for diarrhea.

## 2023-12-18 ENCOUNTER — Ambulatory Visit: Admission: EM | Admit: 2023-12-18 | Discharge: 2023-12-18 | Disposition: A | Discharge status: Home / Self Care

## 2023-12-18 DIAGNOSIS — B029 Zoster without complications: Secondary | ICD-10-CM

## 2023-12-18 MED ORDER — VALACYCLOVIR HCL 1 G PO TABS: 1000.0000 mg | ORAL_TABLET | Freq: Three times a day (TID) | ORAL | 0 refills | Status: AC

## 2023-12-18 NOTE — ED Provider Notes (Signed)
 [facility]    CSN: [id number] Arrival date & time: 12/18/23  1655      History   Chief Complaint Chief Complaint  Patient presents with   Rash    HPI Kristin Todd is a 28 y.o. female presenting for painful vesicular rash of right upper back, shoulder and chest x 3 days.  Patient reports pain in this area couple days before onset of symptoms.  She has been on prednisone to treat for possible costochondritis.  She is on her last day of prednisone.  She has also been taking Tylenol .  Patient denies any breathing difficulty breathing, fever, cough.  History of chickenpox.  HPI  Past Medical History:  Diagnosis Date   Headache(784.0)     Patient Active Problem List   Diagnosis Date Noted   Migraine without aura and without status migrainosus, not intractable 01/08/2013    History reviewed. No pertinent surgical history.  OB History   No obstetric history on file.      Home Medications    Prior to Admission medications   Medication Sig Start Date End Date Taking? Authorizing Provider  valACYclovir  (VALTREX ) 1000 MG tablet Take 1 tablet (1,000 mg total) by mouth 3 (three) times daily. 12/18/23  Yes Arvis Huxley B, PA-C  cyclobenzaprine (FLEXERIL) 10 MG tablet Take 10 mg by mouth 3 (three) times daily as needed for muscle spasms.    [provider]  dicyclomine  (BENTYL ) 20 MG tablet Take 1 tablet (20 mg total) by mouth every 8 (eight) hours as needed for spasms (Abdominal cramping). 04/09/20   Ward, Josette SAILOR, DO  Lactobacillus (PROBIOTIC ACIDOPHILUS) TABS Take 1 capsule by mouth daily. 04/09/20   Ward, Josette SAILOR, DO  naproxen (NAPROSYN) 500 MG tablet Take 500 mg by mouth 2 (two) times daily with a meal.    [provider]  ondansetron  (ZOFRAN  ODT) 4 MG disintegrating tablet Take 1 tablet (4 mg total) by mouth every 6 (six) hours as needed for nausea or vomiting. 04/09/20   Ward, Josette SAILOR, DO  predniSONE (DELTASONE) 20 MG tablet Take 1  tablet (20 mg total) by mouth 2 (two) times daily for 5 days 12/13/23 12/18/23  [provider]    Family History Family History  Problem Relation Age of Onset   Migraines Paternal Aunt    Seizures Paternal Uncle        Seizures as a child    Social History Social History   Tobacco Use   Smoking status: Never   Smokeless tobacco: Never  Substance Use Topics   Alcohol use: No   Drug use: No     Allergies   Naproxen   Review of Systems Review of Systems  Constitutional:  Negative for fatigue and fever.  Respiratory:  Negative for shortness of breath.   Cardiovascular:  Positive for chest pain.  Musculoskeletal:  Positive for back pain. Negative for joint swelling.  Skin:  Positive for rash.  Neurological:  Negative for weakness and headaches.     Physical Exam Triage Vital Signs ED Triage Vitals  Encounter Vitals Group     BP 12/18/23 1726 117/79     Girls Systolic BP Percentile --      Girls Diastolic BP Percentile --      Boys Systolic BP Percentile --      Boys Diastolic BP Percentile --      Pulse Rate 12/18/23 1726 80     Resp 12/18/23 1726 16     Temp  12/18/23 1726 98.4 F (36.9 C)     Temp Source 12/18/23 1726 Oral     SpO2 12/18/23 1726 98 %     Weight 12/18/23 1725 137 lb (62.1 kg)     Height 12/18/23 1725 5' 4 (1.626 m)     Head Circumference --      Peak Flow --      Pain Score 12/18/23 1732 5     Pain Loc --      Pain Education --      Exclude from Growth Chart --    No data found.  Updated Vital Signs BP 117/79 (BP Location: Right Arm)   Pulse 80   Temp 98.4 F (36.9 C) (Oral)   Resp 16   Ht 5' 4 (1.626 m)   Wt 137 lb (62.1 kg)   LMP 12/15/2023 (Exact Date)   SpO2 98%   BMI 23.52 kg/m     Physical Exam Vitals and nursing note reviewed.  Constitutional:      General: She is not in acute distress.    Appearance: Normal appearance. She is not ill-appearing or toxic-appearing.  HENT:     Head: Normocephalic and  atraumatic.  Eyes:     General: No scleral icterus.       Right eye: No discharge.        Left eye: No discharge.     Conjunctiva/sclera: Conjunctivae normal.  Cardiovascular:     Rate and Rhythm: Normal rate and regular rhythm.     Heart sounds: Normal heart sounds.  Pulmonary:     Effort: Pulmonary effort is normal. No respiratory distress.     Breath sounds: Normal breath sounds.  Musculoskeletal:     Cervical back: Neck supple.  Skin:    General: Skin is dry.     Findings: Rash (tender vesicular rash right upper back and along right axilla) present.  Neurological:     General: No focal deficit present.     Mental Status: She is alert. Mental status is at baseline.     Motor: No weakness.     Gait: Gait normal.  Psychiatric:        Mood and Affect: Mood normal.        Behavior: Behavior normal.         UC Treatments / Results  Labs (all labs ordered are listed, but only abnormal results are displayed) Labs Reviewed - No data to display  EKG   Radiology No results found.  Procedures Procedures (including critical care time)  Medications Ordered in UC Medications - No data to display  Initial Impression / Assessment and Plan / UC Course  I have reviewed the triage vital signs and the nursing notes.  Pertinent labs & imaging results that were available during my care of the patient were reviewed by me and considered in my medical decision making (see chart for details).   28 year old female presents for painful vesicular rash of back and right axilla x 3 days.  Had pain in the right side of her chest and upper back a couple days before onset of symptoms and has been on prednisone for suspected costochondritis.  See images included in chart.  This is consistent with shingles.  Discussed this with patient.  Will begin her on Valtrex .  Discussed typical course of illness.  Reviewed who she is contagious to.  Discussed using OTC meds as needed for pain relief.   Reviewed return precautions.   Final Clinical Impressions(s) /  UC Diagnoses   Final diagnoses:  Herpes zoster without complication     Discharge Instructions      - Rash is consistent with shingles. - I sent an antiviral medication to pharmacy.  You should not have any new areas of rash - Continue Tylenol  and may apply topical lidocaine for pain. - You are contagious to anyone who has not had chickenpox or been vaccinated for chickenpox.  Avoid elderly, pregnant women, babies, immunocompromised people.  Contagious until the rash crusts over and dries up within about 7 to 10 days from onset.    ED Prescriptions     Medication Sig Dispense Auth. Provider   valACYclovir  (VALTREX ) 1000 MG tablet Take 1 tablet (1,000 mg total) by mouth 3 (three) times daily. 21 tablet Arvis Jolan NOVAK, PA-C      PDMP not reviewed this encounter.   Arvis Jolan NOVAK, PA-C 12/18/23 [phone]

## 2023-12-18 NOTE — Discharge Instructions (Addendum)
-   Rash is consistent with shingles. - I sent an antiviral medication to pharmacy.  You should not have any new areas of rash - Continue Tylenol  and may apply topical lidocaine for pain. - You are contagious to anyone who has not had chickenpox or been vaccinated for chickenpox.  Avoid elderly, pregnant women, babies, immunocompromised people.  Contagious until the rash crusts over and dries up within about 7 to 10 days from onset.

## 2023-12-18 NOTE — ED Triage Notes (Signed)
 Pt c/o rash in back x4 days. States area itchy & painful.Currently on last dose of PRED d/t costochondritis.
